# Patient Record
Sex: Female | Born: 1970 | Race: Black or African American | Hispanic: No | Marital: Single | Smoking: Never smoker
Health system: Southern US, Community
[De-identification: ages and names within clinical notes are randomized; demographics above are authoritative.]

## PROBLEM LIST (undated history)

## (undated) DIAGNOSIS — N939 Abnormal uterine and vaginal bleeding, unspecified: Secondary | ICD-10-CM

## (undated) DIAGNOSIS — D649 Anemia, unspecified: Secondary | ICD-10-CM

## (undated) HISTORY — PX: CHOLECYSTECTOMY: SHX55

---

## 2002-04-29 HISTORY — PX: CHOLECYSTECTOMY: SHX55

## 2002-11-03 ENCOUNTER — Encounter: Admission: RE | Admit: 2002-11-03 | Discharge: 2002-11-03 | Payer: Self-pay | Admitting: Occupational Medicine

## 2002-11-03 ENCOUNTER — Encounter: Payer: Self-pay | Admitting: Occupational Medicine

## 2003-01-10 ENCOUNTER — Encounter: Payer: Self-pay | Admitting: Family Medicine

## 2003-01-10 ENCOUNTER — Ambulatory Visit (HOSPITAL_COMMUNITY): Admission: RE | Admit: 2003-01-10 | Discharge: 2003-01-10 | Payer: Self-pay | Admitting: Family Medicine

## 2003-03-07 ENCOUNTER — Observation Stay (HOSPITAL_COMMUNITY): Admission: RE | Admit: 2003-03-07 | Discharge: 2003-03-08 | Payer: Self-pay | Admitting: General Surgery

## 2010-07-31 ENCOUNTER — Other Ambulatory Visit: Payer: Self-pay | Admitting: Obstetrics and Gynecology

## 2012-03-19 ENCOUNTER — Other Ambulatory Visit: Payer: Self-pay | Admitting: Obstetrics and Gynecology

## 2012-03-19 DIAGNOSIS — Z1231 Encounter for screening mammogram for malignant neoplasm of breast: Secondary | ICD-10-CM

## 2012-05-01 ENCOUNTER — Ambulatory Visit
Admission: RE | Admit: 2012-05-01 | Discharge: 2012-05-01 | Disposition: A | Discharge status: Home / Self Care | Payer: BC Managed Care – PPO | Source: Ambulatory Visit | Attending: Obstetrics and Gynecology | Admitting: Obstetrics and Gynecology

## 2012-05-01 DIAGNOSIS — Z1231 Encounter for screening mammogram for malignant neoplasm of breast: Secondary | ICD-10-CM

## 2013-03-31 ENCOUNTER — Other Ambulatory Visit: Payer: Self-pay

## 2013-03-31 DIAGNOSIS — Z1231 Encounter for screening mammogram for malignant neoplasm of breast: Secondary | ICD-10-CM

## 2013-04-30 ENCOUNTER — Other Ambulatory Visit: Payer: Self-pay

## 2013-05-03 ENCOUNTER — Ambulatory Visit
Admission: RE | Admit: 2013-05-03 | Discharge: 2013-05-03 | Disposition: A | Discharge status: Home / Self Care | Payer: BC Managed Care – PPO | Source: Ambulatory Visit

## 2013-05-03 DIAGNOSIS — Z1231 Encounter for screening mammogram for malignant neoplasm of breast: Secondary | ICD-10-CM

## 2013-06-30 ENCOUNTER — Ambulatory Visit: Payer: BC Managed Care – PPO | Admitting: Emergency Medicine

## 2013-06-30 ENCOUNTER — Ambulatory Visit: Payer: BC Managed Care – PPO

## 2013-06-30 VITALS — BP 118/62 | HR 95 | Temp 98.4°F | Resp 18 | Ht 65.5 in | Wt 246.0 lb

## 2013-06-30 DIAGNOSIS — M7710 Lateral epicondylitis, unspecified elbow: Secondary | ICD-10-CM

## 2013-06-30 DIAGNOSIS — M25529 Pain in unspecified elbow: Secondary | ICD-10-CM

## 2013-06-30 MED ORDER — MELOXICAM 15 MG PO TABS: 15.0000 mg | ORAL_TABLET | Freq: Every day | ORAL | Status: DC

## 2013-06-30 NOTE — Progress Notes (Signed)
   Subjective:    Patient ID: Darlene Huber, female    DOB: 11-29-1970, 43 y.o.   MRN: [id number]  HPI   Darlene Huber is a very pleasant 43 yr old female here with concern for Right elbow pain.  She reports she fell and landed on that elbow in 2007 - at that time, nothing was broken.  She has had no problems with the elbow since until the last couple months when she developed pain insidiously.  No new injury.  The pain is at the lateral epicondyle, extending to the forearm.  She cannot pinpoint specific activities that worsen pain, occ has pain even at rest.  The pain is not constant.  She denies repetitive activity.  She does not play sports.  She denies numbness or tingling.  She is Right hand dominant.  Has been using Aleve prn without relief.  She has been icing as well.  She would like an xray today.     Review of Systems  Constitutional: Negative.   Respiratory: Negative.   Cardiovascular: Negative.   Musculoskeletal: Positive for arthralgias (right elbow).  Skin: Negative.   Neurological: Negative for weakness and numbness.       Objective:   Physical Exam  Vitals reviewed. Constitutional: She is oriented to person, place, and time. She appears well-developed and well-nourished. No distress.  HENT:  Head: Normocephalic and atraumatic.  Eyes: Conjunctivae are normal. No scleral icterus.  Cardiovascular: Intact distal pulses.   Pulmonary/Chest: Effort normal.  Musculoskeletal:       Right shoulder: Normal.       Right elbow: She exhibits normal range of motion, no swelling, no effusion and no deformity. No tenderness found.       Right wrist: Normal.  Right elbow is non tender on exam; she has full A/PROM; there is no tenderness over the lateral epicondyle but she does have pain on resisted wrist extension  Neurological: She is alert and oriented to person, place, and time.  Skin: Skin is warm and dry.  Psychiatric: She has a normal mood and affect. Her behavior is normal.       [facility] reading (PRIMARY) by  Dr. Cleta Albertsaub - normal      Assessment & Plan:  1. Lateral epicondylitis Darlene Huber is a very pleasant 43 yr old female with several months of Right elbow pain.  Insidious in onset.  Remote trauma to the elbow about 8 yrs ago.  Xrays normal today.  Suspect lateral epicondylitis given pain with resisted wrist extension.  Will treat with mobic, ice, exercises, stretches.  If worsening or not improvement in the next 2-3 wks would try PT  - meloxicam (MOBIC) 15 MG tablet; Take 1 tablet (15 mg total) by mouth daily.  Dispense: 30 tablet; Refill: 1  2. Elbow pain  - DG Elbow Complete Right; Future - meloxicam (MOBIC) 15 MG tablet; Take 1 tablet (15 mg total) by mouth daily.  Dispense: 30 tablet; Refill: 1  Pt to call or RTC if worsening or not improving  E. Frances FurbishElizabeth Aeden Matranga MHS, PA-C [facility] 3/4/201510:52 AM

## 2013-06-30 NOTE — Patient Instructions (Signed)
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 Although the condition is often called tennis or golfer's elbow, it is caused by any repetitive action performed by your elbow. HOME CARE INSTRUCTIONS  If the condition has been short lived, rest may be the only treatment required. Using your opposite hand or arm to perform the task may help. Even changing your grip may help rest the extremity. These may even prevent the condition from recurring.  Longer standing problems, however, will often be relieved faster by:  Using anti-inflammatory agents.  Applying ice packs for 30 minutes at the end of the working day, at bed time, or when activities are finished.  Your caregiver may also have you wear a splint or sling. This will allow the inflamed tendon to heal. At times, steroid injections aided with a local anesthetic will be required along with splinting for 1 to 2 weeks. Two to three steroid injections will often solve the problem. In some long standing cases, the inflamed tendon does not respond to conservative (non-surgical) therapy. Then surgery may be required to repair it. MAKE SURE YOU:   Understand these instructions.  Will watch your condition.  Will get help right away if you are not doing well or get worse. Document Released: 04/15/2005 Document Revised: 07/08/2011 Document Reviewed: 12/02/2007 [facility] Patient Information 2014 [place], [place].

## 2013-07-06 ENCOUNTER — Telehealth: Payer: Self-pay

## 2013-07-06 NOTE — Telephone Encounter (Signed)
PT STATES SHE WAS SEEN FOR AN ILLNESS AND ISN'T GETTING ANY BETTER, NEED TO HAVE SOMETHING ELSE CALLED IN, PLEASE CALL [phone]     [facility] [place]

## 2013-07-06 NOTE — Telephone Encounter (Signed)
Spoke with pt- assured her that she should give it a little more time for improvement. Pt understands and agrees. Will RTC if her elbow worsens.

## 2013-09-01 ENCOUNTER — Telehealth: Payer: Self-pay | Admitting: Genetic Counselor

## 2013-09-01 NOTE — Telephone Encounter (Signed)
LEFT MESSAGE FOR PATIENT TO RETURN CALL TO SCHEDULE GENETIC APPT.  °

## 2013-09-01 NOTE — Telephone Encounter (Signed)
S/W PATIENT AND GAVE GENETIC APPT FOR 06/11 @ 12:30 W/GENETIC COUNSELOR

## 2013-10-07 ENCOUNTER — Ambulatory Visit (HOSPITAL_BASED_OUTPATIENT_CLINIC_OR_DEPARTMENT_OTHER): Payer: BC Managed Care – PPO | Admitting: Genetic Counselor

## 2013-10-07 ENCOUNTER — Other Ambulatory Visit: Payer: BC Managed Care – PPO

## 2013-10-07 DIAGNOSIS — Z800 Family history of malignant neoplasm of digestive organs: Secondary | ICD-10-CM

## 2013-10-07 DIAGNOSIS — IMO0002 Reserved for concepts with insufficient information to code with codable children: Secondary | ICD-10-CM

## 2013-10-07 DIAGNOSIS — Z803 Family history of malignant neoplasm of breast: Secondary | ICD-10-CM | POA: Insufficient documentation

## 2013-10-07 NOTE — Progress Notes (Signed)
HISTORY OF PRESENT ILLNESS: Dr. Garwin Brothers requested a cancer genetics consultation for Darlene Huber, a 43 y.o. female, due to a family history of breast cancer.  Darlene Huber presents to clinic today to discuss the possibility of a hereditary predisposition to cancer, genetic testing, and to further clarify her future cancer risks, as well as potential cancer risk for family members. Darlene Huber has no personal history of cancer.   Past Surgical History  Procedure Laterality Date   Cholecystectomy     History   Social History   Marital Status: Single    Spouse Name: N/A    Number of Children: N/A   Years of Education: N/A   Social History Main Topics   Smoking status: Never Smoker    Smokeless tobacco: Not on file   Alcohol Use: No   Drug Use: No   Sexual Activity: Not on file   Other Topics Concern   Not on file   Social History Narrative   No narrative on file     FAMILY HISTORY:  During the visit, a 4-generation pedigree was obtained. Significant diagnoses include the following:  Family History  Problem Relation Age of Onset   Cancer Sister 23    breast cancer (sister = Darlene Huber; DOB:[birth year] 08[birth year] 1972 ; died at age 42. Had genetic testing at [facility] for the Breast/Ovarian Cancer gene panel  in December 2014 through Darlene Huber. Testing was negative for mutations in any of the genes including BRCA1/2   Cancer Maternal Aunt 65    breast   Cancer Maternal Uncle     throat cancer   Cancer Paternal Aunt 55    breast   Cancer Maternal Grandmother 75    pancreatic   Cancer Maternal Aunt 75    breast   Cancer Maternal Aunt 40    breast    Darlene Huber ancestry is of African American and Caucasian descent. There is no known Jewish ancestry or consanguinity.  GENETIC COUNSELING ASSESSMENT / PLAN: Darlene Huber is a 43 y.o. female with a family history of breast cancer suggestive of a hereditary predisposition to cancer. Her sister, Darlene Huber [facility] MR# [id number];  DOB: 06/[birth year]72) who was diagnosed with breast cancer at age 30, underwent genetic testing at [facility] in December 2014. Her sister's test was performed at [facility] and was called the "Breast/Ovarian Cancer Panel". This panel included the genes: ATM, BARD1, BRCA1, BRCA2, BRIP1, CDH1, CHEK2, EPCAM, FANCC, MLH1, MSH2, MSH6, NBN, PALB2, PMS2, PTEN, RAD51C, RAD51D, STK11, TP53, and XRCC2.  The report date is: March 31, 2013.  Testing of these genes included sequencing and comprehensive rearrangement testing, and did not reveal a mutation in any of the genes.   Based on Darlene Huber family history, we discussed today that Darlene Huber sister, who was diagnosed with breast cancer at age 38, was the best person in the family to have genetic testing because she was diagnosed with breast cancer at the youngest age. Given that her sister's genetic test was negative/normal and no mutations were identified in any of the genes tested, we do not recommend testing for Darlene Huber, at this point in time, as we suspect her test would also be negative. However, we strongly encouraged Darlene Huber to remain in contact with cancer genetics annually so that we can continuously update the family history and inform her of any changes in cancer genetics and testing that may be of benefit for her and her family.   We also recommended Ms.  Huber continue to follow the cancer screening guidelines provided to her by her healthcare providers, which at minimum should include an annual mammogram, an annual clinical breast exam and performing breast self-exams. She should also have a gynecological exam at intervals determined by her PCP or gynecologist, and should have a colonoscopy at age 43.   Ms.  Huber questions were answered to her satisfaction today. Our contact information was provided should additional questions or concerns arise.   Thank you for the referral and allowing [place] to share in the care of your patient.   The  patient was seen for a total of 40 minutes in face-to-face genetic counseling.  This patient was discussed with Darlene Huber who agrees with the above.    _______________________________________________________________________ For Office Staff:  Number of people involved in session: 2 Was an Intern/ student involved with case: no

## 2013-10-08 ENCOUNTER — Ambulatory Visit
Admission: RE | Admit: 2013-10-08 | Discharge: 2013-10-08 | Disposition: A | Discharge status: Home / Self Care | Payer: BC Managed Care – PPO | Source: Ambulatory Visit | Attending: Family Medicine | Admitting: Family Medicine

## 2013-10-08 ENCOUNTER — Other Ambulatory Visit: Payer: Self-pay | Admitting: Family Medicine

## 2013-10-08 DIAGNOSIS — M25562 Pain in left knee: Secondary | ICD-10-CM

## 2014-04-04 ENCOUNTER — Other Ambulatory Visit: Payer: Self-pay

## 2014-04-04 DIAGNOSIS — Z1231 Encounter for screening mammogram for malignant neoplasm of breast: Secondary | ICD-10-CM

## 2014-05-04 ENCOUNTER — Ambulatory Visit
Admission: RE | Admit: 2014-05-04 | Discharge: 2014-05-04 | Disposition: A | Discharge status: Home / Self Care | Payer: BLUE CROSS/BLUE SHIELD | Source: Ambulatory Visit

## 2014-05-04 DIAGNOSIS — Z1231 Encounter for screening mammogram for malignant neoplasm of breast: Secondary | ICD-10-CM

## 2015-04-20 ENCOUNTER — Other Ambulatory Visit: Payer: Self-pay

## 2015-04-20 DIAGNOSIS — Z803 Family history of malignant neoplasm of breast: Secondary | ICD-10-CM

## 2015-04-20 DIAGNOSIS — Z1231 Encounter for screening mammogram for malignant neoplasm of breast: Secondary | ICD-10-CM

## 2015-05-11 ENCOUNTER — Ambulatory Visit
Admission: RE | Admit: 2015-05-11 | Discharge: 2015-05-11 | Disposition: A | Discharge status: Home / Self Care | Payer: BLUE CROSS/BLUE SHIELD | Source: Ambulatory Visit

## 2015-05-11 DIAGNOSIS — Z803 Family history of malignant neoplasm of breast: Secondary | ICD-10-CM

## 2015-05-11 DIAGNOSIS — Z1231 Encounter for screening mammogram for malignant neoplasm of breast: Secondary | ICD-10-CM

## 2015-10-04 DIAGNOSIS — Z6841 Body Mass Index (BMI) 40.0 and over, adult: Secondary | ICD-10-CM | POA: Diagnosis not present

## 2015-10-04 DIAGNOSIS — Z1151 Encounter for screening for human papillomavirus (HPV): Secondary | ICD-10-CM | POA: Diagnosis not present

## 2015-10-04 DIAGNOSIS — Z01419 Encounter for gynecological examination (general) (routine) without abnormal findings: Secondary | ICD-10-CM | POA: Diagnosis not present

## 2016-04-12 ENCOUNTER — Other Ambulatory Visit: Payer: Self-pay | Admitting: Family Medicine

## 2016-04-12 DIAGNOSIS — Z1231 Encounter for screening mammogram for malignant neoplasm of breast: Secondary | ICD-10-CM

## 2016-05-13 ENCOUNTER — Ambulatory Visit
Admission: RE | Admit: 2016-05-13 | Discharge: 2016-05-13 | Disposition: A | Discharge status: Home / Self Care | Payer: BLUE CROSS/BLUE SHIELD | Source: Ambulatory Visit | Attending: Family Medicine | Admitting: Family Medicine

## 2016-05-13 DIAGNOSIS — Z1231 Encounter for screening mammogram for malignant neoplasm of breast: Secondary | ICD-10-CM | POA: Diagnosis not present

## 2016-10-09 DIAGNOSIS — Z6841 Body Mass Index (BMI) 40.0 and over, adult: Secondary | ICD-10-CM | POA: Diagnosis not present

## 2016-10-09 DIAGNOSIS — Z1151 Encounter for screening for human papillomavirus (HPV): Secondary | ICD-10-CM | POA: Diagnosis not present

## 2016-10-09 DIAGNOSIS — Z01419 Encounter for gynecological examination (general) (routine) without abnormal findings: Secondary | ICD-10-CM | POA: Diagnosis not present

## 2017-03-31 ENCOUNTER — Other Ambulatory Visit: Payer: Self-pay | Admitting: Family Medicine

## 2017-03-31 DIAGNOSIS — Z1231 Encounter for screening mammogram for malignant neoplasm of breast: Secondary | ICD-10-CM

## 2017-05-14 ENCOUNTER — Ambulatory Visit
Admission: RE | Admit: 2017-05-14 | Discharge: 2017-05-14 | Disposition: A | Discharge status: Home / Self Care | Payer: BLUE CROSS/BLUE SHIELD | Source: Ambulatory Visit | Attending: Family Medicine | Admitting: Family Medicine

## 2017-05-14 DIAGNOSIS — Z1231 Encounter for screening mammogram for malignant neoplasm of breast: Secondary | ICD-10-CM | POA: Diagnosis not present

## 2017-05-15 ENCOUNTER — Other Ambulatory Visit: Payer: Self-pay | Admitting: Family Medicine

## 2017-05-15 DIAGNOSIS — R928 Other abnormal and inconclusive findings on diagnostic imaging of breast: Secondary | ICD-10-CM

## 2017-05-20 ENCOUNTER — Ambulatory Visit
Admission: RE | Admit: 2017-05-20 | Discharge: 2017-05-20 | Disposition: A | Discharge status: Home / Self Care | Payer: BLUE CROSS/BLUE SHIELD | Source: Ambulatory Visit | Attending: Family Medicine | Admitting: Family Medicine

## 2017-05-20 DIAGNOSIS — R928 Other abnormal and inconclusive findings on diagnostic imaging of breast: Secondary | ICD-10-CM

## 2017-05-20 DIAGNOSIS — R922 Inconclusive mammogram: Secondary | ICD-10-CM | POA: Diagnosis not present

## 2017-05-20 DIAGNOSIS — N6310 Unspecified lump in the right breast, unspecified quadrant: Secondary | ICD-10-CM | POA: Diagnosis not present

## 2017-11-11 DIAGNOSIS — Z6841 Body Mass Index (BMI) 40.0 and over, adult: Secondary | ICD-10-CM | POA: Diagnosis not present

## 2017-11-11 DIAGNOSIS — Z1151 Encounter for screening for human papillomavirus (HPV): Secondary | ICD-10-CM | POA: Diagnosis not present

## 2017-11-11 DIAGNOSIS — Z01419 Encounter for gynecological examination (general) (routine) without abnormal findings: Secondary | ICD-10-CM | POA: Diagnosis not present

## 2017-11-24 DIAGNOSIS — Z131 Encounter for screening for diabetes mellitus: Secondary | ICD-10-CM | POA: Diagnosis not present

## 2017-11-24 DIAGNOSIS — Z1329 Encounter for screening for other suspected endocrine disorder: Secondary | ICD-10-CM | POA: Diagnosis not present

## 2017-11-24 DIAGNOSIS — Z130 Encounter for screening for diseases of the blood and blood-forming organs and certain disorders involving the immune mechanism: Secondary | ICD-10-CM | POA: Diagnosis not present

## 2017-11-24 DIAGNOSIS — Z13220 Encounter for screening for lipoid disorders: Secondary | ICD-10-CM | POA: Diagnosis not present

## 2017-11-24 DIAGNOSIS — Z0000 Encounter for general adult medical examination without abnormal findings: Secondary | ICD-10-CM | POA: Diagnosis not present

## 2018-04-28 ENCOUNTER — Other Ambulatory Visit: Payer: Self-pay | Admitting: Family Medicine

## 2018-04-28 ENCOUNTER — Other Ambulatory Visit: Payer: Self-pay | Admitting: Radiology

## 2018-04-28 DIAGNOSIS — Z1231 Encounter for screening mammogram for malignant neoplasm of breast: Secondary | ICD-10-CM

## 2018-05-21 ENCOUNTER — Ambulatory Visit
Admission: RE | Admit: 2018-05-21 | Discharge: 2018-05-21 | Disposition: A | Discharge status: Home / Self Care | Payer: BLUE CROSS/BLUE SHIELD | Source: Ambulatory Visit | Attending: Family Medicine | Admitting: Family Medicine

## 2018-05-21 DIAGNOSIS — Z1231 Encounter for screening mammogram for malignant neoplasm of breast: Secondary | ICD-10-CM | POA: Diagnosis not present

## 2018-11-13 DIAGNOSIS — Z01419 Encounter for gynecological examination (general) (routine) without abnormal findings: Secondary | ICD-10-CM | POA: Diagnosis not present

## 2018-11-13 DIAGNOSIS — Z6841 Body Mass Index (BMI) 40.0 and over, adult: Secondary | ICD-10-CM | POA: Diagnosis not present

## 2018-11-13 DIAGNOSIS — Z1151 Encounter for screening for human papillomavirus (HPV): Secondary | ICD-10-CM | POA: Diagnosis not present

## 2019-04-07 ENCOUNTER — Other Ambulatory Visit: Payer: Self-pay | Admitting: Obstetrics and Gynecology

## 2019-04-07 DIAGNOSIS — Z1231 Encounter for screening mammogram for malignant neoplasm of breast: Secondary | ICD-10-CM

## 2019-05-25 ENCOUNTER — Ambulatory Visit
Admission: RE | Admit: 2019-05-25 | Discharge: 2019-05-25 | Disposition: A | Discharge status: Home / Self Care | Payer: BC Managed Care – PPO | Source: Ambulatory Visit | Attending: Obstetrics and Gynecology | Admitting: Obstetrics and Gynecology

## 2019-05-25 ENCOUNTER — Other Ambulatory Visit: Payer: Self-pay

## 2019-05-25 DIAGNOSIS — Z1231 Encounter for screening mammogram for malignant neoplasm of breast: Secondary | ICD-10-CM | POA: Diagnosis not present

## 2019-10-29 DIAGNOSIS — Z01419 Encounter for gynecological examination (general) (routine) without abnormal findings: Secondary | ICD-10-CM | POA: Diagnosis not present

## 2019-10-29 DIAGNOSIS — Z6841 Body Mass Index (BMI) 40.0 and over, adult: Secondary | ICD-10-CM | POA: Diagnosis not present

## 2019-11-05 DIAGNOSIS — Z0000 Encounter for general adult medical examination without abnormal findings: Secondary | ICD-10-CM | POA: Diagnosis not present

## 2019-11-05 DIAGNOSIS — Z833 Family history of diabetes mellitus: Secondary | ICD-10-CM | POA: Diagnosis not present

## 2019-11-05 DIAGNOSIS — Z13220 Encounter for screening for lipoid disorders: Secondary | ICD-10-CM | POA: Diagnosis not present

## 2019-11-05 DIAGNOSIS — Z1329 Encounter for screening for other suspected endocrine disorder: Secondary | ICD-10-CM | POA: Diagnosis not present

## 2019-11-05 DIAGNOSIS — Z130 Encounter for screening for diseases of the blood and blood-forming organs and certain disorders involving the immune mechanism: Secondary | ICD-10-CM | POA: Diagnosis not present

## 2020-04-24 ENCOUNTER — Other Ambulatory Visit: Payer: Self-pay | Admitting: Family Medicine

## 2020-04-24 DIAGNOSIS — Z1231 Encounter for screening mammogram for malignant neoplasm of breast: Secondary | ICD-10-CM

## 2020-05-25 ENCOUNTER — Other Ambulatory Visit: Payer: Self-pay

## 2020-05-25 ENCOUNTER — Ambulatory Visit
Admission: RE | Admit: 2020-05-25 | Discharge: 2020-05-25 | Disposition: A | Discharge status: Home / Self Care | Payer: BC Managed Care – PPO | Source: Ambulatory Visit | Attending: Family Medicine | Admitting: Family Medicine

## 2020-05-25 DIAGNOSIS — Z1231 Encounter for screening mammogram for malignant neoplasm of breast: Secondary | ICD-10-CM

## 2021-04-24 ENCOUNTER — Other Ambulatory Visit: Payer: Self-pay | Admitting: Family Medicine

## 2021-04-24 DIAGNOSIS — Z1231 Encounter for screening mammogram for malignant neoplasm of breast: Secondary | ICD-10-CM

## 2021-05-28 ENCOUNTER — Ambulatory Visit
Admission: RE | Admit: 2021-05-28 | Discharge: 2021-05-28 | Disposition: A | Discharge status: Home / Self Care | Payer: BC Managed Care – PPO | Source: Ambulatory Visit | Attending: Family Medicine | Admitting: Family Medicine

## 2021-05-28 DIAGNOSIS — Z1231 Encounter for screening mammogram for malignant neoplasm of breast: Secondary | ICD-10-CM

## 2021-11-30 ENCOUNTER — Other Ambulatory Visit: Payer: Self-pay | Admitting: Obstetrics and Gynecology

## 2021-11-30 DIAGNOSIS — Z803 Family history of malignant neoplasm of breast: Secondary | ICD-10-CM

## 2021-12-04 ENCOUNTER — Other Ambulatory Visit: Payer: BC Managed Care – PPO

## 2021-12-06 ENCOUNTER — Ambulatory Visit
Admission: RE | Admit: 2021-12-06 | Discharge: 2021-12-06 | Disposition: A | Discharge status: Home / Self Care | Payer: BC Managed Care – PPO | Source: Ambulatory Visit | Attending: Obstetrics and Gynecology | Admitting: Obstetrics and Gynecology

## 2021-12-06 DIAGNOSIS — Z803 Family history of malignant neoplasm of breast: Secondary | ICD-10-CM

## 2021-12-06 MED ORDER — GADOBUTROL 1 MMOL/ML IV SOLN
10.0000 mL | Freq: Once | INTRAVENOUS | Status: AC | PRN
  Administered 2021-12-06: 10 mL via INTRAVENOUS

## 2022-05-01 ENCOUNTER — Other Ambulatory Visit: Payer: Self-pay | Admitting: Family Medicine

## 2022-05-01 DIAGNOSIS — Z1231 Encounter for screening mammogram for malignant neoplasm of breast: Secondary | ICD-10-CM

## 2022-06-20 ENCOUNTER — Ambulatory Visit
Admission: RE | Admit: 2022-06-20 | Discharge: 2022-06-20 | Disposition: A | Discharge status: Home / Self Care | Payer: BC Managed Care – PPO | Source: Ambulatory Visit | Attending: Family Medicine | Admitting: Family Medicine

## 2022-06-20 DIAGNOSIS — Z1231 Encounter for screening mammogram for malignant neoplasm of breast: Secondary | ICD-10-CM

## 2022-06-24 ENCOUNTER — Other Ambulatory Visit: Payer: Self-pay | Admitting: Family Medicine

## 2022-06-24 DIAGNOSIS — R928 Other abnormal and inconclusive findings on diagnostic imaging of breast: Secondary | ICD-10-CM

## 2022-06-26 ENCOUNTER — Other Ambulatory Visit: Payer: Self-pay | Admitting: Obstetrics and Gynecology

## 2022-06-26 DIAGNOSIS — R928 Other abnormal and inconclusive findings on diagnostic imaging of breast: Secondary | ICD-10-CM

## 2022-07-02 ENCOUNTER — Other Ambulatory Visit: Payer: Self-pay | Admitting: Obstetrics and Gynecology

## 2022-07-02 DIAGNOSIS — R928 Other abnormal and inconclusive findings on diagnostic imaging of breast: Secondary | ICD-10-CM

## 2022-07-08 ENCOUNTER — Ambulatory Visit: Payer: BC Managed Care – PPO

## 2022-07-08 ENCOUNTER — Ambulatory Visit
Admission: RE | Admit: 2022-07-08 | Discharge: 2022-07-08 | Disposition: A | Discharge status: Home / Self Care | Payer: BC Managed Care – PPO | Source: Ambulatory Visit | Attending: Family Medicine | Admitting: Family Medicine

## 2022-07-08 DIAGNOSIS — R928 Other abnormal and inconclusive findings on diagnostic imaging of breast: Secondary | ICD-10-CM

## 2022-10-01 HISTORY — PX: OTHER SURGICAL HISTORY: SHX169

## 2022-10-18 ENCOUNTER — Encounter (HOSPITAL_BASED_OUTPATIENT_CLINIC_OR_DEPARTMENT_OTHER): Payer: Self-pay | Admitting: Obstetrics & Gynecology

## 2022-10-18 ENCOUNTER — Other Ambulatory Visit: Payer: Self-pay

## 2022-10-18 NOTE — Progress Notes (Addendum)
Spoke w/ via phone for pre-op interview---pt Lab needs dos----     urine preg.posted 10-18-2022, surgery orders pending           Lab results------none COVID test -----patient states asymptomatic no test needed Arrive at -------800 am 10-28-2022 NPO after MN NO Solid Food.  Clear liquids from MN until---700 am Med rec completed Medications to take morning of surgery -----none Diabetic medication -----n/a Patient instructed no nail polish to be worn day of surgery Patient instructed to bring photo id and insurance card day of surgery Patient aware to have Driver (ride ) / caregiver   Darlene Huber father  for 24 hours after surgery  Patient Special Instructions -----none Pre-Op special Instructions -----none Patient verbalized understanding of instructions that were given at this phone interview. Patient denies shortness of breath, chest pain, fever, cough at this phone interview.

## 2022-10-21 ENCOUNTER — Other Ambulatory Visit: Payer: Self-pay | Admitting: Obstetrics & Gynecology

## 2022-10-28 ENCOUNTER — Other Ambulatory Visit: Payer: Self-pay

## 2022-10-28 ENCOUNTER — Encounter (HOSPITAL_BASED_OUTPATIENT_CLINIC_OR_DEPARTMENT_OTHER): Payer: Self-pay | Admitting: Obstetrics & Gynecology

## 2022-10-28 ENCOUNTER — Ambulatory Visit (HOSPITAL_BASED_OUTPATIENT_CLINIC_OR_DEPARTMENT_OTHER): Payer: BC Managed Care – PPO | Admitting: Anesthesiology

## 2022-10-28 ENCOUNTER — Ambulatory Visit (HOSPITAL_BASED_OUTPATIENT_CLINIC_OR_DEPARTMENT_OTHER)
Admission: RE | Admit: 2022-10-28 | Discharge: 2022-10-28 | Disposition: A | Discharge status: Home / Self Care | Payer: BC Managed Care – PPO | Attending: Obstetrics & Gynecology | Admitting: Obstetrics & Gynecology

## 2022-10-28 ENCOUNTER — Encounter (HOSPITAL_BASED_OUTPATIENT_CLINIC_OR_DEPARTMENT_OTHER)
Admission: RE | Disposition: A | Discharge status: Home / Self Care | Payer: Self-pay | Source: Home / Self Care | Attending: Obstetrics & Gynecology

## 2022-10-28 DIAGNOSIS — N840 Polyp of corpus uteri: Secondary | ICD-10-CM | POA: Diagnosis not present

## 2022-10-28 DIAGNOSIS — N939 Abnormal uterine and vaginal bleeding, unspecified: Secondary | ICD-10-CM | POA: Insufficient documentation

## 2022-10-28 DIAGNOSIS — Z01818 Encounter for other preprocedural examination: Secondary | ICD-10-CM

## 2022-10-28 HISTORY — DX: Abnormal uterine and vaginal bleeding, unspecified: N93.9

## 2022-10-28 HISTORY — DX: Anemia, unspecified: D64.9

## 2022-10-28 HISTORY — PX: DILATATION & CURETTAGE/HYSTEROSCOPY WITH MYOSURE: SHX6511

## 2022-10-28 HISTORY — DX: Polyp of corpus uteri: N84.0

## 2022-10-28 LAB — CBC
HCT: 40.1 % (ref 36.0–46.0)
Hemoglobin: 13.1 g/dL (ref 12.0–15.0)
MCH: 32.4 pg (ref 26.0–34.0)
MCHC: 32.7 g/dL (ref 30.0–36.0)
MCV: 99.3 fL (ref 80.0–100.0)
Platelets: 388 10*3/uL (ref 150–400)
RBC: 4.04 MIL/uL (ref 3.87–5.11)
RDW: 13.6 % (ref 11.5–15.5)
WBC: 10.7 10*3/uL — ABNORMAL HIGH (ref 4.0–10.5)
nRBC: 0 % (ref 0.0–0.2)

## 2022-10-28 LAB — POCT PREGNANCY, URINE: Preg Test, Ur: NEGATIVE

## 2022-10-28 LAB — BASIC METABOLIC PANEL
Anion gap: 8 (ref 5–15)
BUN: 11 mg/dL (ref 6–20)
CO2: 22 mmol/L (ref 22–32)
Calcium: 9.1 mg/dL (ref 8.9–10.3)
Chloride: 107 mmol/L (ref 98–111)
Creatinine, Ser: 1 mg/dL (ref 0.44–1.00)
GFR, Estimated: >60 mL/min (ref >60)
Glucose, Bld: 96 mg/dL (ref 70–99)
Potassium: 5.1 mmol/L (ref 3.5–5.1)
Sodium: 137 mmol/L (ref 135–145)

## 2022-10-28 LAB — TYPE AND SCREEN
ABO/RH(D): O POS
Antibody Screen: NEGATIVE

## 2022-10-28 SURGERY — DILATATION & CURETTAGE/HYSTEROSCOPY WITH MYOSURE
Anesthesia: General

## 2022-10-28 MED ORDER — ACETAMINOPHEN 500 MG PO TABS
ORAL_TABLET | ORAL | Status: AC
  Filled 2022-10-28: qty 2

## 2022-10-28 MED ORDER — LIDOCAINE 2% (20 MG/ML) 5 ML SYRINGE
INTRAMUSCULAR | Status: DC | PRN
  Administered 2022-10-28: 60 mg via INTRAVENOUS

## 2022-10-28 MED ORDER — DEXAMETHASONE SODIUM PHOSPHATE 10 MG/ML IJ SOLN
INTRAMUSCULAR | Status: DC | PRN
  Administered 2022-10-28: 10 mg via INTRAVENOUS

## 2022-10-28 MED ORDER — LIDOCAINE-EPINEPHRINE 1 %-1:100000 IJ SOLN
INTRAMUSCULAR | Status: DC | PRN
  Administered 2022-10-28: 10 mL

## 2022-10-28 MED ORDER — POVIDONE-IODINE 10 % EX SWAB: 2.0000 | Freq: Once | CUTANEOUS | Status: DC

## 2022-10-28 MED ORDER — MIDAZOLAM HCL 2 MG/2ML IJ SOLN
INTRAMUSCULAR | Status: AC
  Filled 2022-10-28: qty 2

## 2022-10-28 MED ORDER — FENTANYL CITRATE (PF) 250 MCG/5ML IJ SOLN
INTRAMUSCULAR | Status: DC | PRN
  Administered 2022-10-28: 50 ug via INTRAVENOUS
  Administered 2022-10-28 (×2): 25 ug via INTRAVENOUS

## 2022-10-28 MED ORDER — CEFAZOLIN SODIUM-DEXTROSE 2-4 GM/100ML-% IV SOLN
INTRAVENOUS | Status: AC
  Filled 2022-10-28: qty 100

## 2022-10-28 MED ORDER — CEFAZOLIN SODIUM-DEXTROSE 2-4 GM/100ML-% IV SOLN
2.0000 g | INTRAVENOUS | Status: AC
  Administered 2022-10-28: 2 g via INTRAVENOUS

## 2022-10-28 MED ORDER — AMISULPRIDE (ANTIEMETIC) 5 MG/2ML IV SOLN: 10.0000 mg | Freq: Once | INTRAVENOUS | Status: DC | PRN

## 2022-10-28 MED ORDER — KETOROLAC TROMETHAMINE 30 MG/ML IJ SOLN
INTRAMUSCULAR | Status: DC | PRN
  Administered 2022-10-28: 30 mg via INTRAVENOUS

## 2022-10-28 MED ORDER — FENTANYL CITRATE (PF) 100 MCG/2ML IJ SOLN: 25.0000 ug | INTRAMUSCULAR | Status: DC | PRN

## 2022-10-28 MED ORDER — ONDANSETRON HCL 4 MG/2ML IJ SOLN
INTRAMUSCULAR | Status: DC | PRN
  Administered 2022-10-28: 4 mg via INTRAVENOUS

## 2022-10-28 MED ORDER — MIDAZOLAM HCL 2 MG/2ML IJ SOLN
INTRAMUSCULAR | Status: DC | PRN
  Administered 2022-10-28: 2 mg via INTRAVENOUS

## 2022-10-28 MED ORDER — PROPOFOL 10 MG/ML IV BOLUS
INTRAVENOUS | Status: AC
  Filled 2022-10-28: qty 20

## 2022-10-28 MED ORDER — FENTANYL CITRATE (PF) 100 MCG/2ML IJ SOLN
INTRAMUSCULAR | Status: AC
  Filled 2022-10-28: qty 2

## 2022-10-28 MED ORDER — PROPOFOL 10 MG/ML IV BOLUS
INTRAVENOUS | Status: DC | PRN
  Administered 2022-10-28: 200 mg via INTRAVENOUS

## 2022-10-28 MED ORDER — IBUPROFEN 200 MG PO TABS: 600.0000 mg | ORAL_TABLET | Freq: Four times a day (QID) | ORAL | 0 refills | Status: AC | PRN

## 2022-10-28 MED ORDER — LACTATED RINGERS IV SOLN: INTRAVENOUS | Status: DC

## 2022-10-28 MED ORDER — ACETAMINOPHEN 500 MG PO TABS
1000.0000 mg | ORAL_TABLET | Freq: Once | ORAL | Status: AC
  Administered 2022-10-28: 1000 mg via ORAL

## 2022-10-28 SURGICAL SUPPLY — 17 items
CATH ROBINSON RED A/P 16FR (CATHETERS) ×1 IMPLANT
DEVICE MYOSURE LITE (MISCELLANEOUS) IMPLANT
DEVICE MYOSURE REACH (MISCELLANEOUS) IMPLANT
DRSG TELFA 3X8 NADH STRL (GAUZE/BANDAGES/DRESSINGS) ×1 IMPLANT
GAUZE 4X4 16PLY ~~LOC~~+RFID DBL (SPONGE) ×2 IMPLANT
GLOVE BIO SURGEON STRL SZ7 (GLOVE) ×1 IMPLANT
GLOVE BIOGEL PI IND STRL 7.0 (GLOVE) ×2 IMPLANT
GOWN STRL REUS W/TWL LRG LVL3 (GOWN DISPOSABLE) ×1 IMPLANT
IV NS IRRIG 3000ML ARTHROMATIC (IV SOLUTION) ×1 IMPLANT
KIT PROCEDURE FLUENT (KITS) ×1 IMPLANT
KIT TURNOVER CYSTO (KITS) ×1 IMPLANT
PACK VAGINAL MINOR WOMEN LF (CUSTOM PROCEDURE TRAY) ×1 IMPLANT
PAD OB MATERNITY 4.3X12.25 (PERSONAL CARE ITEMS) ×1 IMPLANT
SEAL CERVICAL OMNI LOK (ABLATOR) IMPLANT
SEAL ROD LENS SCOPE MYOSURE (ABLATOR) ×1 IMPLANT
SLEEVE SCD COMPRESS KNEE MED (STOCKING) ×1 IMPLANT
TOWEL OR 17X24 6PK STRL BLUE (TOWEL DISPOSABLE) ×1 IMPLANT

## 2022-10-28 NOTE — Anesthesia Postprocedure Evaluation (Signed)
Anesthesia Post Note  Patient: Darlene Huber  Procedure(s) Performed: DILATATION & CURETTAGE/HYSTEROSCOPY WITH MYOSURE     Patient location during evaluation: PACU Anesthesia Type: General Level of consciousness: awake and alert Pain management: pain level controlled Vital Signs Assessment: post-procedure vital signs reviewed and stable Respiratory status: spontaneous breathing, nonlabored ventilation, respiratory function stable and patient connected to nasal cannula oxygen Cardiovascular status: blood pressure returned to baseline and stable Postop Assessment: no apparent nausea or vomiting Anesthetic complications: no  No notable events documented.  Last Vitals:  Vitals:   10/28/22 1145 10/28/22 1200  BP: 130/73 (!) 141/77  Pulse: 93 91  Resp: 17 17  Temp:    SpO2: 97% 96%    Last Pain:  Vitals:   10/28/22 1200  TempSrc:   PainSc: 0-No pain                 Kennieth Rad

## 2022-10-28 NOTE — Op Note (Signed)
10/28/2022 Darlene Huber [birth year]  Preoperative diagnosis: abnormal uterine bleeding, endometrial polyp  Postop diagnosis: as above.  Procedure: Hysteroscopic polypectomy with Myosure Reach and endometrial curettage  Anesthesia General via LMA  Surgeon: Shea Evans, MD  Assistant: NOne IV fluids 500 cc LR  Estimated blood loss 5 cc  Urine output: NA (voided before moving to OR_ Complications none  Condition stable  Disposition PACU  Specimen: Endometrial polyp and endometrial curettings   Procedure  Indication: Abnormal uterine bleeding, office sono suspected endometrial polyp. Patient was counseled on procedure incl risks/ complications including infection, bleeding, damage to internal organs, she understood and agrees, gave informed written consent.  Patient was brought to the operating room with IV running. Time out was carried out. She received preop 2 gm Ancef. She underwent general anesthesia via LMA without complications. She was given dorsolithotomy position. Parts were prepped and draped in standard fashion.  Bimanual exam revealed uterus to be anteverted and normal size. Speculum was placed and cervix was grasped with single-tooth tenaculum. Cervical block with 10 cc 1% Lidocaine with epinephrine. The uterus was sounded to 7 cm. Cervical os was dilated to 15 [place] dilator. Myosure h\ysteroscope was introduced in the uterine cavity under vision, using saline for irrigation. Findings: Single large endometrial polyp from right upper endometrium, filling the cavity, both tubal ostii seen and normal , rest of the endometrium was thin.  Hysteroscopic polypectomy was performed with Myosure Reach and specimen sent to path. Hysteroscope was removed. Endometrial curettage was performed with sharp curette. All tissue sent to path.  Fluid deficit 300 cc.  All counts are correct x2. No complications. Patient was made supine dorsal anesthesia and brought to the recovery room in stable condition.   Patient will be discharged home today. Follow up in 2 weeks in office. Warning signs of infection and excessive bleeding reviewed.   I performed this surgery.  Shea Evans, MD.

## 2022-10-28 NOTE — H&P (Signed)
Darlene Huber is an 52 y.o. female here for endometrial polyp surgery. Abnormal uterine bleeding. Office sono noted large endometrial polyp. She was not a candidate for office anesthesia procedure due to BMI. She is here for hysteroscopic polypectomy with Myosure and endometrial curettage  Nl pap hx.  Patient's last menstrual period was 08/18/2022.    Past Medical History:  Diagnosis Date   Abnormal uterine bleeding (AUB)    Anemia     Past Surgical History:  Procedure Laterality Date   CHOLECYSTECTOMY  2004   polyp removal  10/01/2022   in office    Family History  Problem Relation Age of Onset   Cancer Sister 31       breast cancer (sister = Danise Mina; DOB:[birth year] 1972 ; died at age 52. Had genetic testing at [facility] for the Breast/Ovarian Cancer gene panel  in December 2014 through [facility]. Testing was negative for mutations in any of the genes including BRCA1/2   Cancer Maternal Aunt 65       breast   Cancer Maternal Aunt 75       breast   Cancer Maternal Aunt 40       breast   Cancer Maternal Uncle        throat cancer   Cancer Paternal Aunt 13       breast   Cancer Maternal Grandmother 11       pancreatic    Social History:  reports that she has never smoked. She does not have any smokeless tobacco history on file. She reports that she does not drink alcohol and does not use drugs.  Allergies: No Known Allergies  Medications Prior to Admission  Medication Sig Dispense Refill Last Dose   Multiple Vitamins-Minerals (MULTIVITAMIN WITH MINERALS) tablet Take 1 tablet by mouth daily.   10/27/2022   VITAMIN D PO Take by mouth daily.   10/27/2022   PRESCRIPTION MEDICATION Generic progesterone daily   10/01/2022    Review of Systems  Blood pressure (!) 141/91, pulse 96, temperature 98.5 F (36.9 C), temperature source Oral, resp. rate 18, height 5\' 5"  (1.651 m), weight 128.8 kg, last menstrual period 08/18/2022, SpO2 99 %. Physical Exam A&O x 3, no acute distress.  Pleasant HEENT neg, no thyromegaly Lungs CTA bilat CV RRR, S1S2 normal Abdo soft, non tender, non acute Extr no edema/ tenderness Pelvic  Uterus bulky, nl cervix, endometrial polyp , no ovaries   Results for orders placed or performed during the hospital encounter of 10/28/22 (from the past 24 hour(s))  Pregnancy, urine POC     Status: None   Collection Time: 10/28/22  7:50 AM  Result Value Ref Range   Preg Test, Ur NEGATIVE NEGATIVE  CBC     Status: Abnormal   Collection Time: 10/28/22  8:07 AM  Result Value Ref Range   WBC 10.7 (H) 4.0 - 10.5 K/uL   RBC 4.04 3.87 - 5.11 MIL/uL   Hemoglobin 13.1 12.0 - 15.0 g/dL   HCT 16.1 09.6 - 04.5 %   MCV 99.3 80.0 - 100.0 fL   MCH 32.4 26.0 - 34.0 pg   MCHC 32.7 30.0 - 36.0 g/dL   RDW 40.9 81.1 - 91.4 %   Platelets 388 150 - 400 K/uL   nRBC 0.0 0.0 - 0.2 %  Type and screen     Status: None (Preliminary result)   Collection Time: 10/28/22  8:07 AM  Result Value Ref Range   ABO/RH(D) PENDING  Antibody Screen PENDING    Sample Expiration      10/31/2022,2359 Performed at [facility], [address], [place], [place] [address]   Basic metabolic panel     Status: None   Collection Time: 10/28/22  8:07 AM  Result Value Ref Range   Sodium 137 135 - 145 mmol/L   Potassium 5.1 3.5 - 5.1 mmol/L   Chloride 107 98 - 111 mmol/L   CO2 22 22 - 32 mmol/L   Glucose, Bld 96 70 - 99 mg/dL   BUN 11 6 - 20 mg/dL   Creatinine, Ser 6.04 0.44 - 1.00 mg/dL   Calcium 9.1 8.9 - 54.0 mg/dL   GFR, Estimated >98 >11 mL/min   Anion gap 8 5 - 15    No results found.  Assessment/Plan: 52 yo female with heavy abnormal uterine bleeding, endometrial polyp noted Here for [facility] endometrial polypectomy and endometrial curettage   Risks/complications of surgery reviewed incl infection, bleeding, damage to internal organs including bladder, bowels, ureters, blood vessels, other risks from anesthesia, VTE and delayed complications of any  surgery, complications in future surgery reviewed.   Robley Fries 10/28/2022, 9:16 AM

## 2022-10-28 NOTE — Anesthesia Procedure Notes (Signed)
Procedure Name: LMA Insertion Date/Time: 10/28/2022 11:00 AM  Performed by: Dairl Ponder, CRNAPre-anesthesia Checklist: Patient identified, Emergency Drugs available, Suction available and Patient being monitored Patient Re-evaluated:Patient Re-evaluated prior to induction Oxygen Delivery Method: Circle System Utilized Preoxygenation: Pre-oxygenation with 100% oxygen Induction Type: IV induction Ventilation: Mask ventilation without difficulty LMA: LMA inserted LMA Size: 4.0 Number of attempts: 1 Airway Equipment and Method: Bite block Placement Confirmation: positive ETCO2 Tube secured with: Tape Dental Injury: Teeth and Oropharynx as per pre-operative assessment

## 2022-10-28 NOTE — Discharge Instructions (Addendum)
  Post Anesthesia Home Care Instructions  Activity: Get plenty of rest for the remainder of the day. A responsible individual must stay with you for 24 hours following the procedure.  For the next 24 hours, DO NOT: -Drive a car -Advertising copywriter -Drink alcoholic beverages -Take any medication unless instructed by your physician -Make any legal decisions or sign important papers.  Meals: Start with liquid foods such as gelatin or soup. Progress to regular foods as tolerated. Avoid greasy, spicy, heavy foods. If nausea and/or vomiting occur, drink only clear liquids until the nausea and/or vomiting subsides. Call your physician if vomiting continues.  Special Instructions/Symptoms: Your throat may feel dry or sore from the anesthesia or the breathing tube placed in your throat during surgery. If this causes discomfort, gargle with warm salt water. The discomfort should disappear within 24 hours.  DISCHARGE INSTRUCTIONS: HYSTEROSCOPY / ENDOMETRIAL ABLATION The following instructions have been prepared to help you care for yourself upon your return home.  May take Ibuprofen after 5 PM.  May take stool softner while taking narcotic pain medication to prevent constipation.  Drink plenty of water.  Personal hygiene:  Use sanitary pads for vaginal drainage, not tampons.  Shower the day after your procedure.  NO tub baths, pools or Jacuzzis for 2-3 weeks.  Wipe front to back after using the bathroom.  Activity and limitations:  Do NOT drive or operate any equipment for 24 hours. The effects of anesthesia are still present and drowsiness may result.  Do NOT rest in bed all day.  Walking is encouraged.  Walk up and down stairs slowly.  You may resume your normal activity in one to two days or as indicated by your physician. Sexual activity: NO intercourse for at least 2 weeks after the procedure, or as indicated by your Doctor.  Diet: Eat a light meal as desired this evening. You may  resume your usual diet tomorrow.  Return to Work: You may resume your work activities in one to two days or as indicated by Therapist, sports.  What to expect after your surgery: Expect to have vaginal bleeding/discharge for 2-3 days and spotting for up to 10 days. It is not unusual to have soreness for up to 1-2 weeks. You may have a slight burning sensation when you urinate for the first day. Mild cramps may continue for a couple of days. You may have a regular period in 2-6 weeks.  Call your doctor for any of the following:  Excessive vaginal bleeding or clotting, saturating and changing one pad every hour.  Inability to urinate 6 hours after discharge from hospital.  Pain not relieved by pain medication.  Fever of 100.4 F or greater.  Unusual vaginal discharge or odor.  May Begin taking Tylenol at 2 PM as needed for cramping/soreness.

## 2022-10-28 NOTE — Anesthesia Preprocedure Evaluation (Signed)
Anesthesia Evaluation  Patient identified by MRN, date of birth, ID band Patient awake    Reviewed: Allergy & Precautions, NPO status , Patient's Chart, lab work & pertinent test results  Airway Mallampati: II  TM Distance: >3 FB Neck ROM: Full    Dental  (+) Dental Advisory Given   Pulmonary neg pulmonary ROS   Pulmonary exam normal        Cardiovascular negative cardio ROS  Rhythm:Regular Rate:Normal     Neuro/Psych negative neurological ROS     GI/Hepatic negative GI ROS, Neg liver ROS,,,  Endo/Other  negative endocrine ROS    Renal/GU negative Renal ROS     Musculoskeletal   Abdominal   Peds  Hematology  (+) Blood dyscrasia, anemia   Anesthesia Other Findings   Reproductive/Obstetrics                             Anesthesia Physical Anesthesia Plan  ASA: 2  Anesthesia Plan: General   Post-op Pain Management: Tylenol PO (pre-op)* and Toradol IV (intra-op)*   Induction: Intravenous  PONV Risk Score and Plan: 3 and Dexamethasone, Ondansetron, Midazolam and Treatment may vary due to age or medical condition  Airway Management Planned: LMA  Additional Equipment:   Intra-op Plan:   Post-operative Plan: Extubation in OR  Informed Consent: I have reviewed the patients History and Physical, chart, labs and discussed the procedure including the risks, benefits and alternatives for the proposed anesthesia with the patient or authorized representative who has indicated his/her understanding and acceptance.     Dental advisory given  Plan Discussed with: CRNA  Anesthesia Plan Comments:        Anesthesia Quick Evaluation

## 2022-10-28 NOTE — Transfer of Care (Signed)
Immediate Anesthesia Transfer of Care Note  Patient: Darlene Huber  Procedure(s) Performed: DILATATION & CURETTAGE/HYSTEROSCOPY WITH MYOSURE  Patient Location: PACU  Anesthesia Type:General  Level of Consciousness: awake, alert , and oriented  Airway & Oxygen Therapy: Patient Spontanous Breathing  Post-op Assessment: Report given to RN and Post -op Vital signs reviewed and stable  Post vital signs: Reviewed and stable  Last Vitals:  Vitals Value Taken Time  BP 125/79 10/28/22 1137  Temp 36.4 C 10/28/22 1137  Pulse 96 10/28/22 1137  Resp 18 10/28/22 1137  SpO2 99 % 10/28/22 1137  Vitals shown include unvalidated device data.  Last Pain:  Vitals:   10/28/22 0826  TempSrc: Oral  PainSc: 0-No pain      Patients Stated Pain Goal: 5 (10/28/22 0826)  Complications: No notable events documented.

## 2022-10-29 ENCOUNTER — Encounter (HOSPITAL_COMMUNITY): Payer: Self-pay

## 2022-10-29 ENCOUNTER — Ambulatory Visit (HOSPITAL_COMMUNITY): Admit: 2022-10-29 | Payer: BC Managed Care – PPO | Admitting: Obstetrics & Gynecology

## 2022-10-29 LAB — SURGICAL PATHOLOGY

## 2022-10-29 SURGERY — DILATION AND EVACUATION, UTERUS
Anesthesia: General

## 2022-10-30 ENCOUNTER — Encounter (HOSPITAL_BASED_OUTPATIENT_CLINIC_OR_DEPARTMENT_OTHER): Payer: Self-pay | Admitting: Obstetrics & Gynecology

## 2022-11-12 SURGERY — Surgical Case
Anesthesia: *Unknown

## 2022-11-13 SURGERY — Surgical Case
Anesthesia: *Unknown

## 2023-05-12 ENCOUNTER — Other Ambulatory Visit: Payer: Self-pay | Admitting: Obstetrics & Gynecology

## 2023-05-12 DIAGNOSIS — Z1231 Encounter for screening mammogram for malignant neoplasm of breast: Secondary | ICD-10-CM

## 2023-06-21 IMAGING — MG MM DIGITAL SCREENING BILAT W/ TOMO AND CAD
8 of 16 series · 8 of 40 positions shown · non-contrast
Comparison: Previous exam(s).

CLINICAL DATA: Screening.

EXAM:
DIGITAL SCREENING BILATERAL MAMMOGRAM WITH TOMOSYNTHESIS AND CAD
TECHNIQUE: Bilateral screening digital craniocaudal and mediolateral oblique
mammograms were obtained. Bilateral screening digital breast
tomosynthesis was performed. The images were evaluated with
computer-aided detection.

[L MLO synth-2D (1 of 2)]
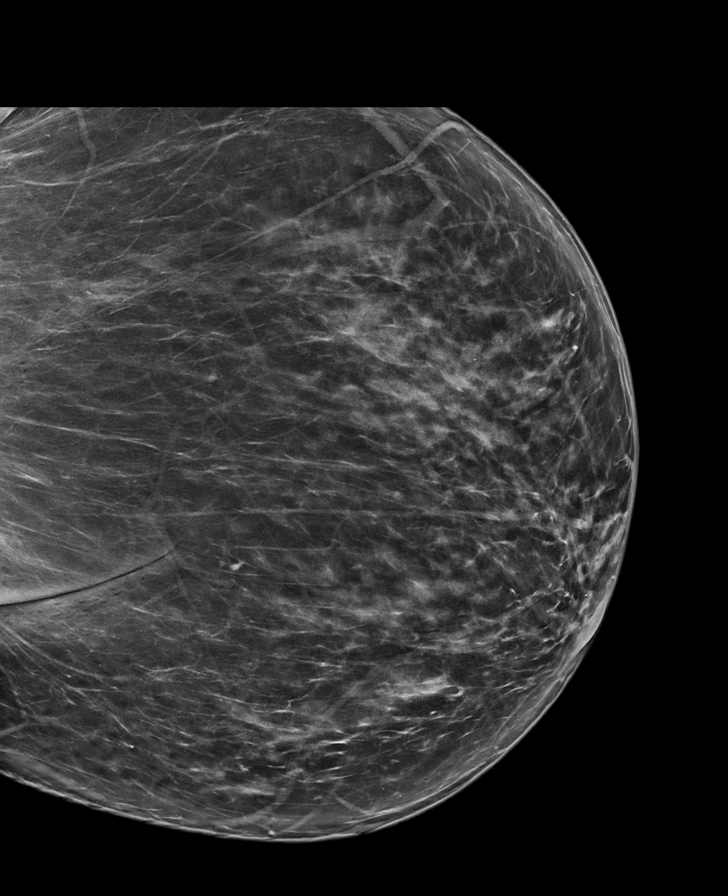

[L MLO synth-2D (2 of 2)]
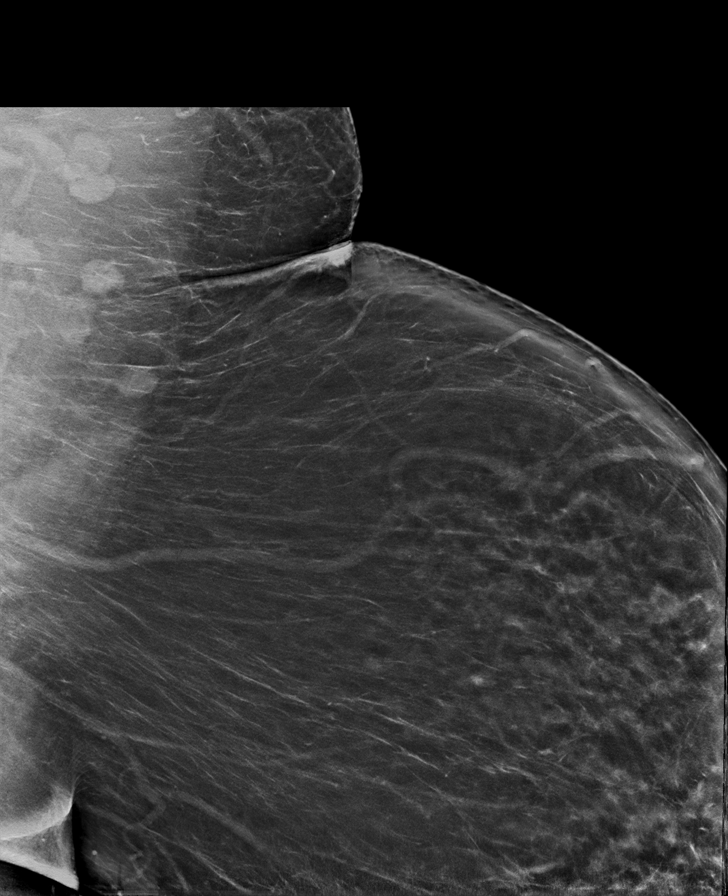

[R MLO synth-2D (1 of 2)]
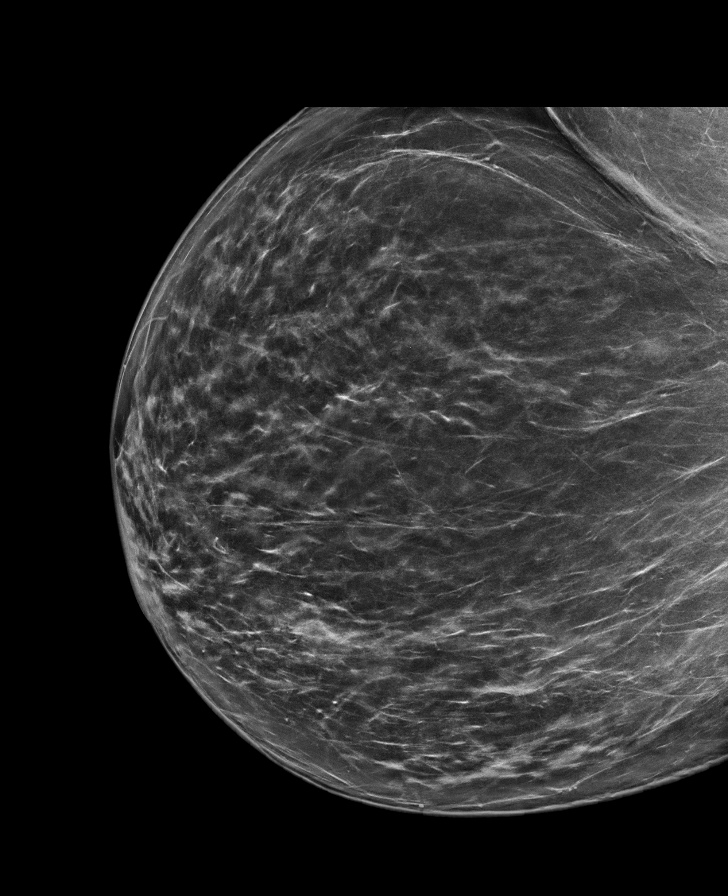

[R MLO synth-2D (2 of 2)]
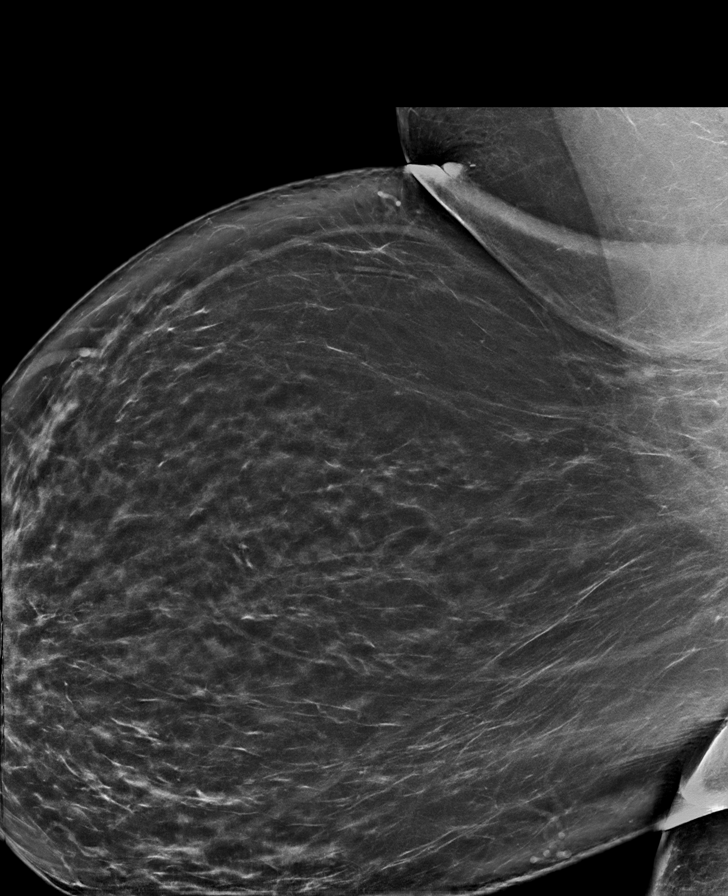

[L CC synth-2D (1 of 2)]
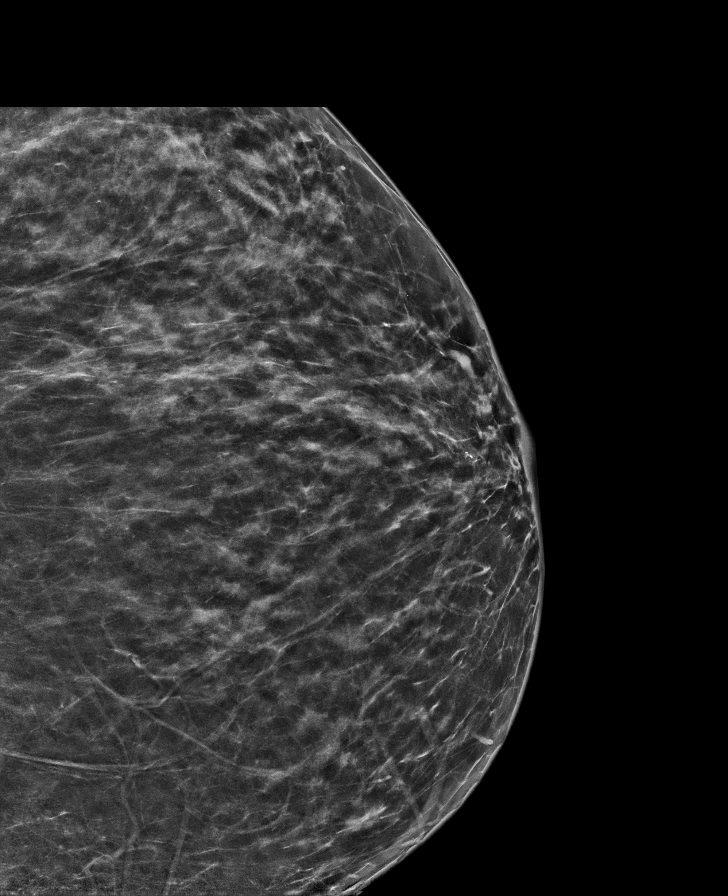

[R CC synth-2D]
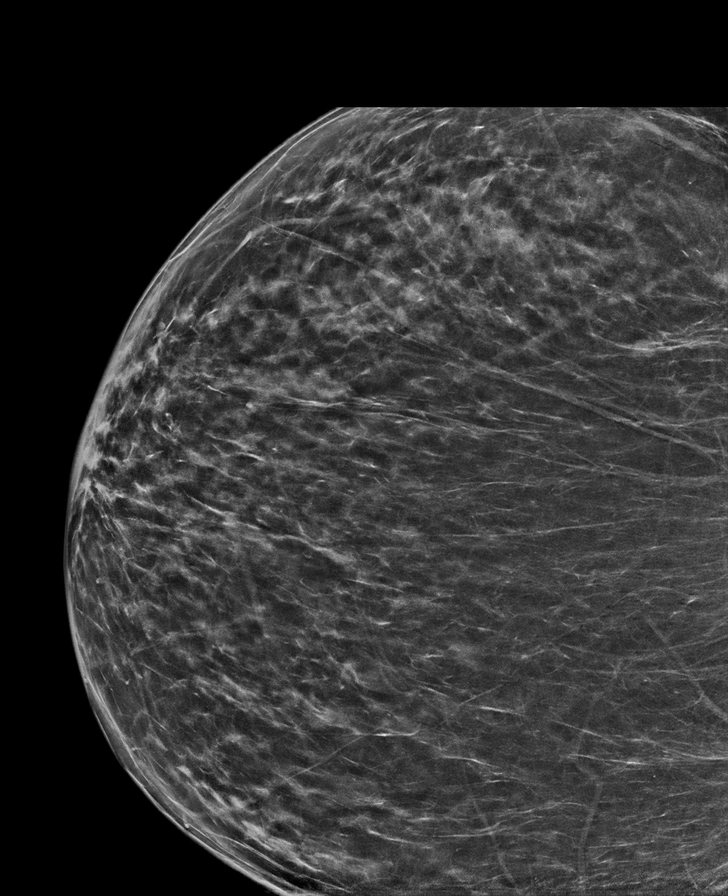

[L CC synth-2D (2 of 2)]
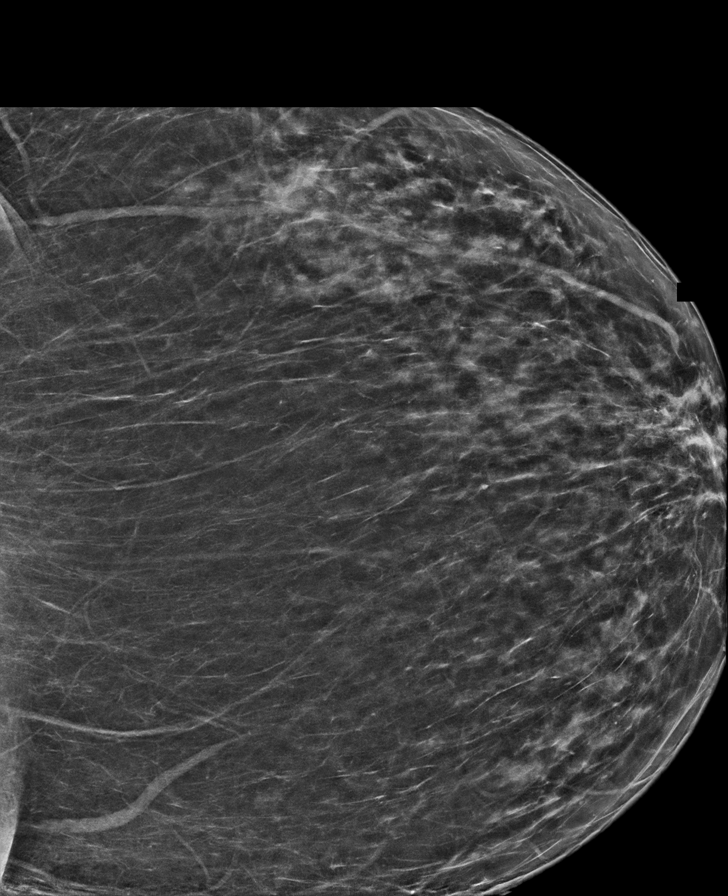

[L CV tomo · tomo slice 38/75.0]
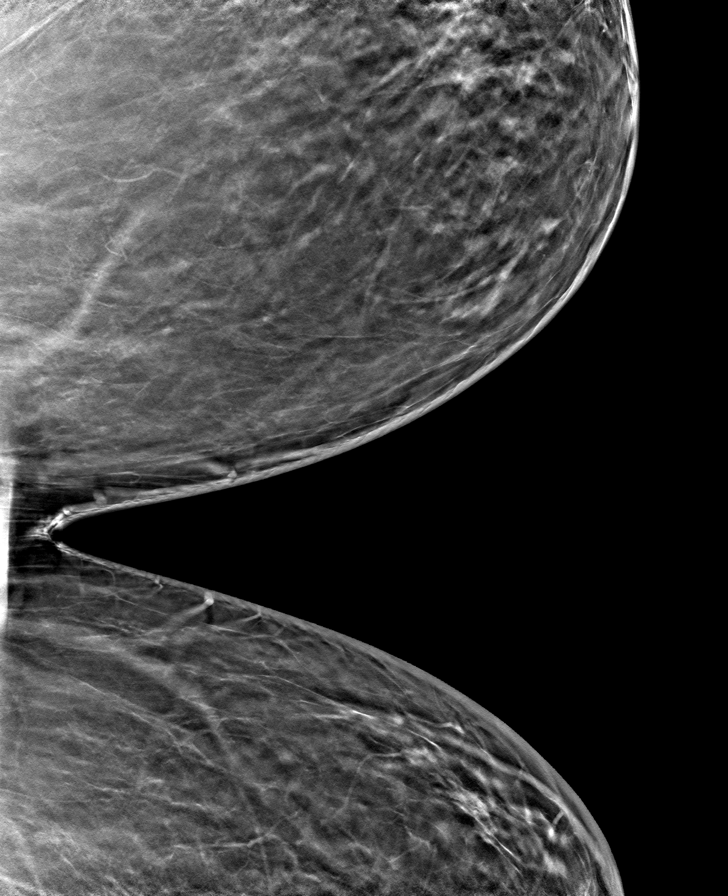

[8 of 40 positions shown; findings below may reference images not displayed]

ACR Breast Density Category b: There are scattered areas of
fibroglandular density.
FINDINGS: There are no findings suspicious for malignancy.
IMPRESSION: No mammographic evidence of malignancy. A result letter of this
screening mammogram will be mailed directly to the patient.

RECOMMENDATION:
Screening mammogram in one year. (Code:51-O-LD2)

BI-RADS CATEGORY  1: Negative.

## 2023-06-23 ENCOUNTER — Ambulatory Visit
Admission: RE | Admit: 2023-06-23 | Discharge: 2023-06-23 | Disposition: A | Discharge status: Home / Self Care | Payer: BC Managed Care – PPO | Source: Ambulatory Visit | Attending: Obstetrics & Gynecology | Admitting: Obstetrics & Gynecology

## 2023-06-23 DIAGNOSIS — Z1231 Encounter for screening mammogram for malignant neoplasm of breast: Secondary | ICD-10-CM

## 2024-05-11 ENCOUNTER — Other Ambulatory Visit: Payer: Self-pay | Admitting: Obstetrics & Gynecology

## 2024-05-11 DIAGNOSIS — Z1231 Encounter for screening mammogram for malignant neoplasm of breast: Secondary | ICD-10-CM

## 2024-06-24 ENCOUNTER — Ambulatory Visit
